# Patient Record
Sex: Male | Born: 2000 | Race: White | Hispanic: No | Marital: Single | Smoking: Current every day smoker
Health system: Southern US, Community
[De-identification: ages and names within clinical notes are randomized; demographics above are authoritative.]

## PROBLEM LIST (undated history)

## (undated) DIAGNOSIS — F909 Attention-deficit hyperactivity disorder, unspecified type: Secondary | ICD-10-CM

## (undated) DIAGNOSIS — F32A Depression, unspecified: Secondary | ICD-10-CM

## (undated) DIAGNOSIS — E71529 X-linked adrenoleukodystrophy, unspecified type: Secondary | ICD-10-CM

## (undated) HISTORY — PX: HERNIA REPAIR: SHX51

---
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
## 2021-07-22 IMAGING — DX DG CHEST 1V PORT
1 series · 1 of 1 positions shown · non-contrast
Comparison: 12/30/2017

CLINICAL DATA: Cough and chest congestion. Pharyngitis. Chills.
Sweat. Chest pain since yesterday.

EXAM:
PORTABLE CHEST 1 VIEW

[chest ap]
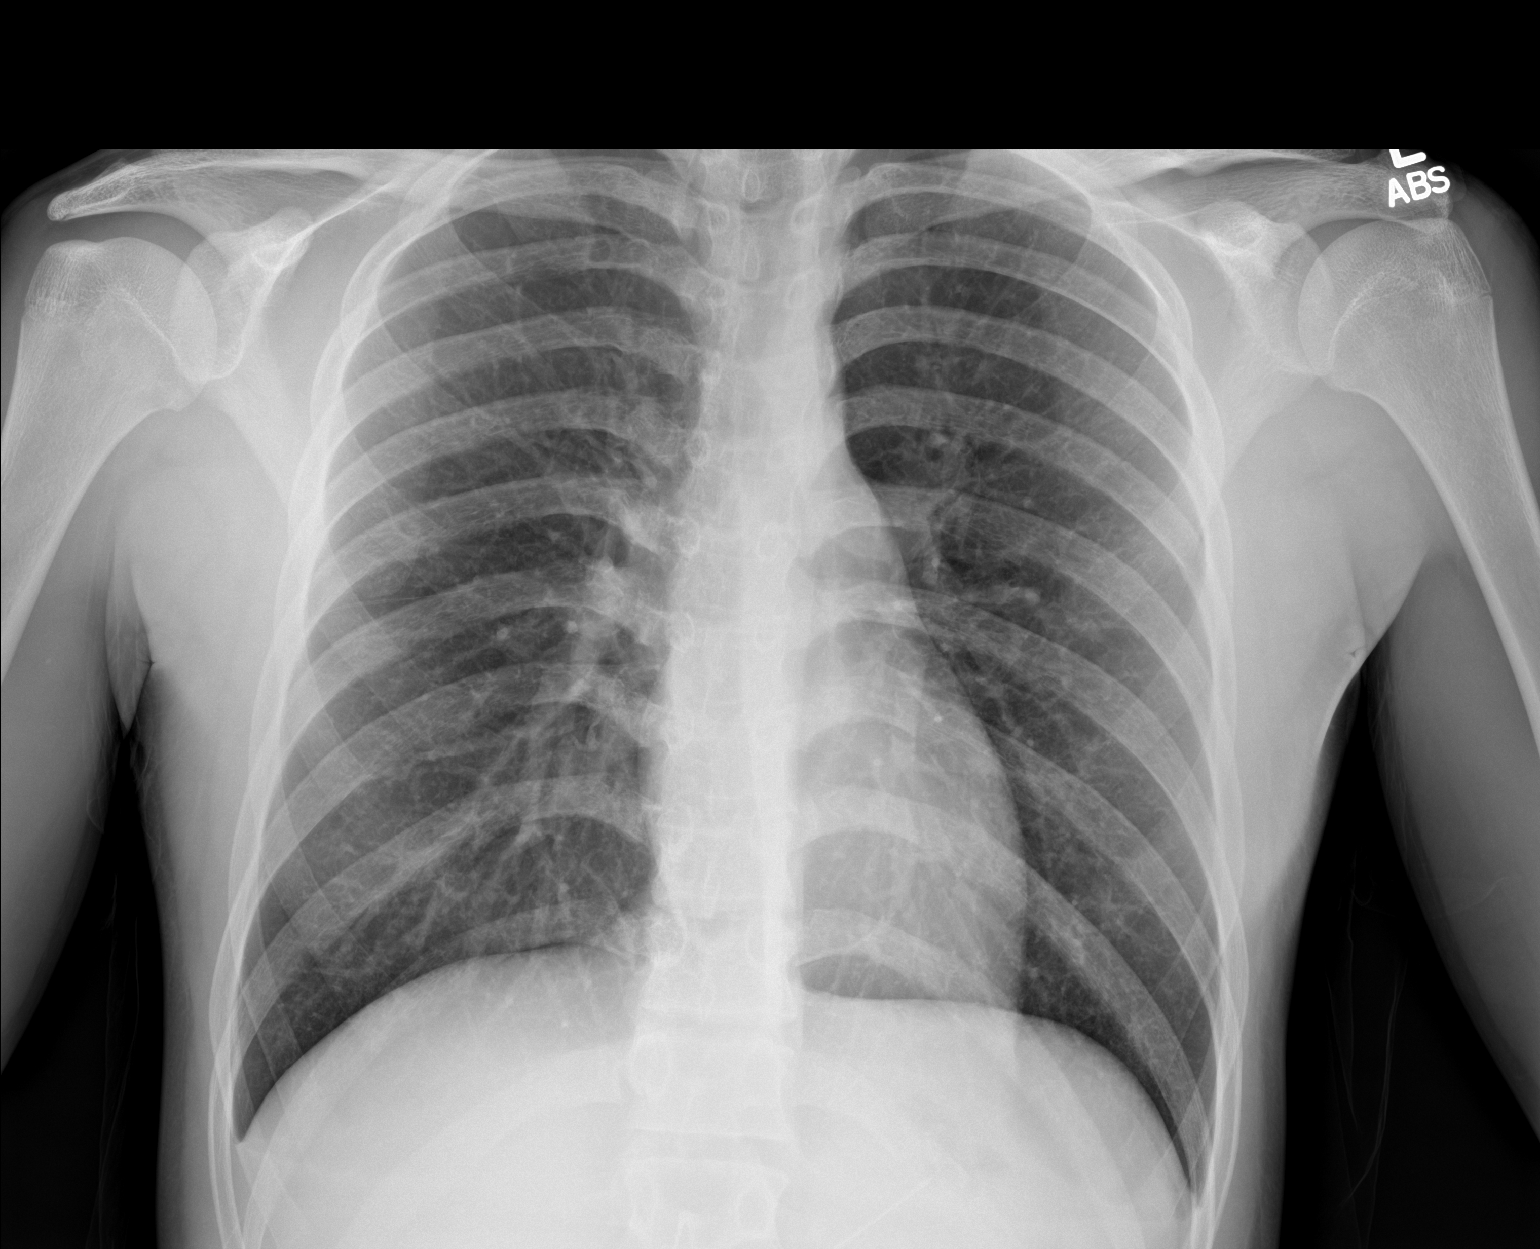

[1 of 1 positions shown; findings below may reference images not displayed]

FINDINGS: The heart size and mediastinal contours are within normal limits.
Both lungs are clear. The visualized skeletal structures are
unremarkable.
IMPRESSION: Normal examination.
